# Patient Record
Sex: Male | Born: 1990 | Race: White | Hispanic: No | Marital: Single | Smoking: Never smoker
Health system: Southern US, Community
[De-identification: ages and names within clinical notes are randomized; demographics above are authoritative.]

## PROBLEM LIST (undated history)

## (undated) DIAGNOSIS — J45909 Unspecified asthma, uncomplicated: Secondary | ICD-10-CM

## (undated) DIAGNOSIS — D649 Anemia, unspecified: Secondary | ICD-10-CM

## (undated) HISTORY — DX: Anemia, unspecified: D64.9

## (undated) HISTORY — DX: Unspecified asthma, uncomplicated: J45.909

## (undated) HISTORY — PX: KNEE ARTHROPLASTY: SHX992

---

## 2018-04-14 ENCOUNTER — Other Ambulatory Visit: Payer: Self-pay | Admitting: Orthopedic Surgery

## 2018-04-14 DIAGNOSIS — M2212 Recurrent subluxation of patella, left knee: Secondary | ICD-10-CM

## 2018-04-22 ENCOUNTER — Ambulatory Visit
Admission: RE | Admit: 2018-04-22 | Discharge: 2018-04-22 | Disposition: A | Discharge status: Home / Self Care | Payer: BLUE CROSS/BLUE SHIELD | Source: Ambulatory Visit | Attending: Orthopedic Surgery | Admitting: Orthopedic Surgery

## 2018-04-22 DIAGNOSIS — M2212 Recurrent subluxation of patella, left knee: Secondary | ICD-10-CM

## 2018-07-18 ENCOUNTER — Encounter: Payer: Self-pay | Admitting: Nurse Practitioner

## 2018-08-09 ENCOUNTER — Encounter: Payer: Self-pay | Admitting: Nurse Practitioner

## 2018-08-09 ENCOUNTER — Encounter (INDEPENDENT_AMBULATORY_CARE_PROVIDER_SITE_OTHER): Payer: Self-pay

## 2018-08-09 ENCOUNTER — Ambulatory Visit (INDEPENDENT_AMBULATORY_CARE_PROVIDER_SITE_OTHER): Payer: BLUE CROSS/BLUE SHIELD | Admitting: Nurse Practitioner

## 2018-08-09 VITALS — BP 110/80 | HR 60 | Ht 74.0 in | Wt 182.8 lb

## 2018-08-09 DIAGNOSIS — R1310 Dysphagia, unspecified: Secondary | ICD-10-CM | POA: Diagnosis not present

## 2018-08-09 NOTE — Patient Instructions (Signed)
If you are age 25 or older, your body mass index should be between 23-30. Your Body mass index is 23.47 kg/m. If this is out of the aforementioned range listed, please consider follow up with your Primary Care Provider.  If you are age 25 or younger, your body mass index should be between 19-25. Your Body mass index is 23.47 kg/m. If this is out of the aformentioned range listed, please consider follow up with your Primary Care Provider.   You have been scheduled for an endoscopy. Please follow written instructions given to you at your visit today. If you use inhalers (even only as needed), please bring them with you on the day of your procedure. Your physician has requested that you go to www.startemmi.com and enter the access code given to you at your visit today. This web site gives a general overview about your procedure. However, you should still follow specific instructions given to you by our office regarding your preparation for the procedure.  Continue Prilosec.  Advised patient to eat small bites, chew well with liquids in between bites to avoid food impaction.  Thank you for choosing me and [facility].   Willette Cluster, NP

## 2018-08-09 NOTE — Progress Notes (Addendum)
    GI Provider:   New to practice             Chief Complaint: swallowing problems  Referring Provider:  Landry Mellow, MD   ASSESSMENT AND PLAN;   Healthy 27 yo male with five year history of progressive solid food dysphagia. Treated in July in [facility] for food impaction. Describes EGD wth removal of meat from esophagus.  -Patient needs EGD to rule out eosinophilic esophagitis.  Stricture seems unlikely given normal esophagram. The risks and benefits of EGD were discussed and the patient agrees to proceed.  -Advised patient to eat small bites, chew well with liquids in between bites to avoid food impaction. -continue PPI started at ED in July.    HPI:    27 year old male with hx of asthma but otherwise healthy presenting with ~ 5 year history of solid food dysphagia. Initially had problems with steak but over last several months he has problems with all meat. No problems swallowing anything else such as bread, potatoes or rice. On July 9th while in [facility] he went to ED with impaction. Underwent EGD with removal of food bolus. Stared on Prilosec in ED, taking it everyday. Doesn't know if better because avoiding meat unless in very small pieces. In high school he would get heartburn with tacos but otherwise no significant GERD sx. Per ED recommendations patient saw an ENT. Barium swallow ordered and normal.   Past Medical History:  Diagnosis Date  . Anemia   . Asthma      Past Surgical History:  Procedure Laterality Date  . KNEE ARTHROPLASTY     Family History  Problem Relation Age of Onset  . Diabetes Maternal Grandmother   . Heart disease Maternal Grandmother   . Diabetes Maternal Grandfather    Social History   Tobacco Use  . Smoking status: Never Smoker  . Smokeless tobacco: Never Used  Substance Use Topics  . Alcohol use: Not Currently  . Drug use: Never   Current Outpatient Medications  Medication Sig Dispense Refill  . ibuprofen (ADVIL,MOTRIN) 800 MG tablet Take  800 mg by mouth every 8 (eight) hours as needed.    Marland Kitchen omeprazole (PRILOSEC OTC) 20 MG tablet Take 20 mg by mouth daily.     No current facility-administered medications for this visit.    No Known Allergies   Review of Systems: All systems reviewed and negative except where noted in HPI.   Creatinine clearance cannot be calculated (No successful lab value found.)   Physical Exam:    Wt Readings from Last 3 Encounters:  08/09/18 182 lb 12.8 oz (82.9 kg)    BP 110/80   Pulse 60   Ht 6\' 2"  (1.88 m)   Wt 182 lb 12.8 oz (82.9 kg)   BMI 23.47 kg/m  Constitutional:  Pleasant male in no acute distress. Psychiatric: Normal mood and affect. Behavior is normal. EENT: Pupils normal.  Conjunctivae are normal. No scleral icterus. Neck supple.  Cardiovascular: Normal rate, regular rhythm. No edema Pulmonary/chest: Effort normal and breath sounds normal. No wheezing, rales or rhonchi. Abdominal: Soft, nondistended, nontender. Bowel sounds active throughout. There are no masses palpable. No hepatomegaly. Neurological: Alert and oriented to person place and time. Skin: Skin is warm and dry. No rashes noted.  Willette Cluster, NP  08/09/2018, 11:36 AM  Cc: Landry Mellow, MI [facility]

## 2018-08-11 ENCOUNTER — Encounter: Payer: Self-pay | Admitting: Nurse Practitioner

## 2018-08-11 ENCOUNTER — Encounter: Payer: Self-pay | Admitting: Gastroenterology

## 2018-08-11 NOTE — Progress Notes (Signed)
Agree with assessment and plan as outlined.  

## 2018-08-25 ENCOUNTER — Ambulatory Visit (AMBULATORY_SURGERY_CENTER): Payer: BLUE CROSS/BLUE SHIELD | Admitting: Gastroenterology

## 2018-08-25 ENCOUNTER — Encounter: Payer: Self-pay | Admitting: Gastroenterology

## 2018-08-25 VITALS — BP 120/88 | HR 73 | Temp 97.8°F | Resp 10 | Ht 74.0 in | Wt 182.0 lb

## 2018-08-25 DIAGNOSIS — K229 Disease of esophagus, unspecified: Secondary | ICD-10-CM | POA: Diagnosis not present

## 2018-08-25 DIAGNOSIS — R1310 Dysphagia, unspecified: Secondary | ICD-10-CM | POA: Diagnosis present

## 2018-08-25 DIAGNOSIS — K209 Esophagitis, unspecified: Secondary | ICD-10-CM

## 2018-08-25 MED ORDER — SODIUM CHLORIDE 0.9 % IV SOLN: 500.0000 mL | Freq: Once | INTRAVENOUS | Status: DC

## 2018-08-25 NOTE — Patient Instructions (Signed)
Handout given on GERD protocol. Biopsies taken to rule out Eosinophilic esophagitis. Eosinophilic esophagitis is benign and treatable.   YOU HAD AN ENDOSCOPIC PROCEDURE TODAY AT THE [facility]:   Refer to the procedure report that was given to you for any specific questions about what was found during the examination.  If the procedure report does not answer your questions, please call your gastroenterologist to clarify.  If you requested that your care partner not be given the details of your procedure findings, then the procedure report has been included in a sealed envelope for you to review at your convenience later.  YOU SHOULD EXPECT: Some feelings of bloating in the abdomen. Passage of more gas than usual.  Walking can help get rid of the air that was put into your GI tract during the procedure and reduce the bloating. If you had a lower endoscopy (such as a colonoscopy or flexible sigmoidoscopy) you may notice spotting of blood in your stool or on the toilet paper. If you underwent a bowel prep for your procedure, you may not have a normal bowel movement for a few days.  Please Note:  You might notice some irritation and congestion in your nose or some drainage.  This is from the oxygen used during your procedure.  There is no need for concern and it should clear up in a day or so.  SYMPTOMS TO REPORT IMMEDIATELY:    Following upper endoscopy (EGD)  Vomiting of blood or coffee ground material  New chest pain or pain under the shoulder blades  Painful or persistently difficult swallowing  New shortness of breath  Fever of 100F or higher  Black, tarry-looking stools  For urgent or emergent issues, a gastroenterologist can be reached at any hour by calling [phone].   DIET:  We do recommend a small meal at first, but then you may proceed to your regular diet.  Drink plenty of fluids but you should avoid alcoholic beverages for 24 hours.  ACTIVITY:  You should  plan to take it easy for the rest of today and you should NOT DRIVE or use heavy machinery until tomorrow (because of the sedation medicines used during the test).    FOLLOW UP: Our staff will call the number listed on your records the next business day following your procedure to check on you and address any questions or concerns that you may have regarding the information given to you following your procedure. If we do not reach you, we will leave a message.  However, if you are feeling well and you are not experiencing any problems, there is no need to return our call.  We will assume that you have returned to your regular daily activities without incident.  If any biopsies were taken you will be contacted by phone or by letter within the next 1-3 weeks.  Please call us at [phone] if you have not heard about the biopsies in 3 weeks.    SIGNATURES/CONFIDENTIALITY: You and/or your care partner have signed paperwork which will be entered into your electronic medical record.  These signatures attest to the fact that that the information above on your After Visit Summary has been reviewed and is understood.  Full responsibility of the confidentiality of this discharge information lies with you and/or your care-partner.

## 2018-08-25 NOTE — Progress Notes (Signed)
Called to room to assist during endoscopic procedure.  Patient ID and intended procedure confirmed with present staff. Received instructions for my participation in the procedure from the performing physician.  

## 2018-08-25 NOTE — Progress Notes (Signed)
Report given to PACU, vss 

## 2018-08-25 NOTE — Op Note (Signed)
[facility] Patient Name: Larry [name] Procedure Date: 08/25/2018 9:47 AM MRN: [id number] Endoscopist: Viviann SpareSteven P. Adela LankArmbruster , MD Age: [age] Referring MD:  Date of Birth: December 31, 1990 Gender: Male Account #: [id number] Procedure:                Upper GI endoscopy Indications:              Dysphagia Medicines:                Monitored Anesthesia Care Procedure:                Pre-Anesthesia Assessment:                           - Prior to the procedure, a History and Physical                            was performed, and patient medications and                            allergies were reviewed. The patient's tolerance of                            previous anesthesia was also reviewed. The risks                            and benefits of the procedure and the sedation                            options and risks were discussed with the patient.                            All questions were answered, and informed consent                            was obtained. Prior Anticoagulants: The patient has                            taken no previous anticoagulant or antiplatelet                            agents. ASA Grade Assessment: II - A patient with                            mild systemic disease. After reviewing the risks                            and benefits, the patient was deemed in                            satisfactory condition to undergo the procedure.                           After obtaining informed consent, the endoscope was  passed under direct vision. Throughout the                            procedure, the patient's blood pressure, pulse, and                            oxygen saturations were monitored continuously. The                            Endoscope was introduced through the mouth, and                            advanced to the second part of duodenum. The upper                            GI endoscopy was accomplished without  difficulty.                            The patient tolerated the procedure well. Scope In: Scope Out: Findings:                 Esophagogastric landmarks were identified: the                            Z-line was found at 39 cm, the gastroesophageal                            junction was found at 39 cm and the upper extent of                            the gastric folds was found at 40 cm from the                            incisors.                           A 1 cm hiatal hernia was present.                           Mild mucosal changes including ringed esophagus and                            longitudinal furrows were found in the entire                            esophagus. Biopsies were obtained from the proximal                            and distal esophagus with cold forceps for                            histology of suspected eosinophilic esophagitis.  The exam of the esophagus was otherwise normal. No                            focal stenosis / stricture appreciated.                           The entire examined stomach was normal.                           The duodenal bulb and second portion of the                            duodenum were normal. Complications:            No immediate complications. Estimated blood loss:                            Minimal. Estimated Blood Loss:     Estimated blood loss was minimal. Impression:               - Esophagogastric landmarks identified.                           - 1 cm hiatal hernia.                           - Mild esophageal mucosal changes suggestive of                            eosinophilic esophagitis. Biopsied. No stenosis                            appreciated.                           - Normal stomach.                           - Normal duodenal bulb and second portion of the                            duodenum. Recommendation:           - Patient has a contact number available for                             emergencies. The signs and symptoms of potential                            delayed complications were discussed with the                            patient. Return to normal activities tomorrow.                            Written discharge instructions were provided to the  patient.                           - Resume previous diet.                           - Continue present medications.                           - Await pathology results. Will plan on starting                            oral flovent and referral for food allergy testing                            if biopsies positive for eosinophilic esophagitis Viviann Spare P. Armbruster, MD 08/25/2018 10:05:25 AM This report has been signed electronically.

## 2018-08-25 NOTE — Progress Notes (Signed)
Pt's states no medical or surgical changes since previsit or office visit. 

## 2018-08-28 ENCOUNTER — Telehealth: Payer: Self-pay | Admitting: *Deleted

## 2018-08-28 NOTE — Telephone Encounter (Signed)
  Follow up Call-  Call back number 08/25/2018  Post procedure Call Back phone  # [phone]  Permission to leave phone message Yes     Patient questions:  Do you have a fever, pain , or abdominal swelling? No. Pain Score  0 *  Have you tolerated food without any problems? Yes.    Have you been able to return to your normal activities? Yes.    Do you have any questions about your discharge instructions: Diet   No. Medications  No. Follow up visit  No.  Do you have questions or concerns about your Care? No.  Actions: * If pain score is 4 or above: No action needed, pain <4.

## 2018-08-30 ENCOUNTER — Other Ambulatory Visit: Payer: Self-pay

## 2018-08-30 DIAGNOSIS — K200 Eosinophilic esophagitis: Secondary | ICD-10-CM

## 2018-08-30 DIAGNOSIS — R1310 Dysphagia, unspecified: Secondary | ICD-10-CM

## 2018-08-30 MED ORDER — FLUTICASONE PROPIONATE HFA 220 MCG/ACT IN AERO: INHALATION_SPRAY | RESPIRATORY_TRACT | 1 refills | Status: AC

## 2018-10-02 ENCOUNTER — Ambulatory Visit (INDEPENDENT_AMBULATORY_CARE_PROVIDER_SITE_OTHER): Payer: BLUE CROSS/BLUE SHIELD | Admitting: Allergy

## 2018-10-02 ENCOUNTER — Encounter: Payer: Self-pay | Admitting: Allergy

## 2018-10-02 VITALS — BP 110/72 | HR 64 | Temp 97.7°F | Resp 16 | Ht 73.8 in | Wt 187.0 lb

## 2018-10-02 DIAGNOSIS — J3089 Other allergic rhinitis: Secondary | ICD-10-CM

## 2018-10-02 DIAGNOSIS — K200 Eosinophilic esophagitis: Secondary | ICD-10-CM | POA: Diagnosis not present

## 2018-10-02 DIAGNOSIS — T781XXD Other adverse food reactions, not elsewhere classified, subsequent encounter: Secondary | ICD-10-CM

## 2018-10-02 DIAGNOSIS — J45998 Other asthma: Secondary | ICD-10-CM | POA: Diagnosis not present

## 2018-10-02 DIAGNOSIS — T7819XD Other adverse food reactions, not elsewhere classified, subsequent encounter: Secondary | ICD-10-CM

## 2018-10-02 MED ORDER — ALBUTEROL SULFATE HFA 108 (90 BASE) MCG/ACT IN AERS: 2.0000 | INHALATION_SPRAY | RESPIRATORY_TRACT | 1 refills | Status: AC | PRN

## 2018-10-02 NOTE — Assessment & Plan Note (Addendum)
One episode of food impaction requiring EGD in the ER. History of difficulty swallowing certain foods for few years occurring once a month. EGD on 08/25/2018 showed eos up to 19/hpf which was done on PPI. Currently on omeprazole 20mg  daily and Flovent 220 2 puffs twice a day.  Today's skin testing showed: borderline positive to peanuts, tree nuts, soy, peas, navy bean, corn, watermelon and cantaloupe.   Discussed with patient that the above foods may or may not be contributing to his symptoms as eosinophilic esophagitis is usually a T cell mediated disease and skin prick test has a low sensitivity and specificity for this.  Start to avoid above items and monitor symptoms.  Continue omeprazole 20mg  daily.  Continue Flovent 220 2 puffs twice a day swallowed. NOTHING to eat or drink for 30 minutes afterwards.  Continue to follow up with GI.

## 2018-10-02 NOTE — Assessment & Plan Note (Signed)
Rhinoconjunctivitis symptoms mainly during the spring for the past few years.  Using Claritin with good benefit.  Consider testing in future.  May use over the counter antihistamines such as Zyrtec (cetirizine), Claritin (loratadine), Allegra (fexofenadine), or Xyzal (levocetirizine) daily as needed.

## 2018-10-02 NOTE — Progress Notes (Signed)
New Patient Note  RE: Larry [name] MRN: [id number] DOB: 06-Sep-1991 Date of Office Visit: 10/02/2018  Referring provider: Benancio Deeds, MD Primary care provider: Patient, No Pcp [name]  Chief Complaint: New Patient (Initial Visit) and Dysphagia (certain foods would like to be tested for poss food allergy)  History of Present Illness: I had the pleasure of seeing Larry [name] for initial evaluation at the [facility] on 10/02/2018. He is a 27 y.o. [name], who is referred here by GI Dr. Fritzi [name] for the evaluation of eosinophilic esophagitis.   For the past few years patient noticed difficulty swallowing [name] certain foods. In July 2019 he had a piece of chicken stuck in his esophagus and went to the ER after 12-13 hours. They did remove the food bolus in the ER. This was the first time he had to get an EGD. This would usually occur every couple of months initially but more recently it was happening on a monthly basis.  Triggers include steak and chicken.   Patient was started on Prilosec 20mg  daily after the ER visit and had no issues but patient has bee more careful about eating meats. He was also started on Flovent 220 2 puffs twice a day after the EGD and tolerating it [name] no issues. No issues [name] swallowing foods since then.  Past work up includes: none Dietary History: patient has been eating other foods including limited milk, eggs, peanut, treenuts, sesame, soy, wheat, meats, fruits and vegetables. Does not like seafood, shellfish.   08/25/2018 EGD: - Esophagogastric landmarks identified. - 1 cm hiatal hernia. - Mild esophageal mucosal changes suggestive of eosinophilic esophagitis. Biopsied. No stenosis appreciated. - Normal stomach. - Normal duodenal bulb and second portion of the duodenum. Pathology report:  - INFLAMED SQUAMOUS MUCOSA [name] FOCAL INCREASED INTRAEPITHELIAL EOSINOPHILS (UP TO 19/HIGH POWER FIELD). - THERE IS NO EVIDENCE OF  DYSPLASIA OR MALIGNANCY.  Assessment and Plan: Larry [name] is a 27 y.o. [name] [name]: Eosinophilic esophagitis One episode of food impaction requiring EGD in the ER. History of difficulty swallowing certain foods for few years occurring once a month. EGD on 08/25/2018 showed eos up to 19/hpf which was done on PPI. Currently on omeprazole 20mg  daily and Flovent 220 2 puffs twice a day.  Today's skin testing showed: borderline positive to peanuts, tree nuts, soy, peas, navy bean, corn, watermelon and cantaloupe.   Discussed [name] patient that the above foods may or may not be contributing to his symptoms as eosinophilic esophagitis is usually a T cell mediated disease and skin prick test has a low sensitivity and specificity for this.  Start to avoid above items and monitor symptoms.  Continue omeprazole 20mg  daily.  Continue Flovent 220 2 puffs twice a day swallowed. NOTHING to eat or drink for 30 minutes afterwards.  Continue to follow up [name] GI.  Other asthma Diagnosed [name] asthma at age 54. Triggers now include exercising in the cold weather.   Today's spirometry was normal.  Monitor symptoms.   May use albuterol rescue inhaler 2 puffs or nebulizer every 4 to 6 hours as needed for shortness of breath, chest tightness, coughing, and wheezing. May use albuterol rescue inhaler 2 puffs 5 to 15 minutes prior to strenuous physical activities. Reviewed proper technique.  Other allergic rhinitis Rhinoconjunctivitis symptoms mainly during the spring for the past few years.  Using Claritin [name] good benefit.  Consider testing in future.  May use over the counter antihistamines such as Zyrtec (cetirizine), Claritin (  loratadine), Allegra (fexofenadine), or Xyzal (levocetirizine) daily as needed.  Return in about 3 months (around 01/02/2019).  Meds ordered this encounter  Medications  . albuterol (PROAIR HFA) 108 (90 Base) MCG/ACT inhaler    Sig: Inhale 2 puffs into the lungs every 4 (four) hours  as needed for wheezing or shortness of breath.    Dispense:  1 Inhaler    Refill:  1   Other allergy screening: Asthma: yes as a child and no issues for over 10 years.  He reports symptoms of shortness of breath, coughing, wheezing in elementary school. Current medications include none.  He reports not using aerochamber [name] asthma inhalers. He tried the following inhalers: albuterol and Flovent. Main asthma triggers are cold weather changes, exercise. In the last month, frequency of asthma symptoms: 0x/week. Frequency of nocturnal symptoms: 0x/month. Frequency of SABA use: 0x/week. Interference [name] physical activity: no. Sleep is undisturbed. In the last 12 months, emergency room visits/urgent care visits/doctor office visits or hospitalizations due to asthma: 0. In the last 12 months, oral steroids courses: 0 Lifetime history of hospitalization for asthma: no. Prior intubations: no. Asthma was diagnosed at age 77. History of pneumonia: no. He was not evaluated by allergist/pulmonologist in the past. Smoking exposure: no. Up to date [name] flu vaccine: not yet.  Rhino conjunctivitis: yes  He reports symptoms of rhinitis, itchy eyes. Symptoms have been going on for many years. The symptoms are present during the spring. Other triggers include exposure to pet dander. He has used Claritin [name] fair improvement in symptoms. Previous work up includes: no. Food allergy: no Medication allergy: no Hymenoptera allergy: no Urticaria: no Eczema:no History of recurrent infections suggestive of immunodeficency: no  Diagnostics: Spirometry:  Tracings reviewed. His effort: Good reproducible efforts. FVC: 5.68 L FEV1: 4.73 L, 93 % predicted FEV1/FVC ratio: 84 % Interpretation: Spirometry consistent [name] normal pattern.  Please see scanned spirometry results for details.  Skin Testing: Food allergy panel. Positive test to: borderline positive to peanuts, tree nuts, soy, peas, navy bean, corn, watermelon and  cantaloupe. Results discussed [name] patient/family. Food Adult Perc - 10/02/18 0900    Time Antigen Placed  4098    Allergen Manufacturer  Larry [name]    Location  Back    Number of allergen test  74     Control-buffer 50% Glycerol  Negative    Control-Histamine 1 mg/ml  2+    1. Peanut  2+   2x2   2. Soybean  2+   2x2   3. Wheat  Negative    4. Sesame  Negative    5. Milk, cow  Negative    6. Egg White, Chicken  Negative    7. Casein  Negative    8. Shellfish Mix  Negative    9. Fish Mix  Negative    10. Cashew  Negative    11. Pecan Food  Negative    12. Walnut Food  2+   2x2   13. Almond  2+   2x2   14. Hazelnut  Negative    15. [place] nut  2+   2x2   16. Coconut  Negative    17. Pistachio  Negative    18. Catfish  Negative    19. Bass  Negative    20. Trout  Negative    21. Tuna  Negative    22. Salmon  Negative    23. Flounder  Negative    24. Codfish  Negative    25.  Shrimp  Negative    26. Crab  Negative    27. Lobster  Negative    28. Oyster  Negative    29. Scallops  Negative    30. Barley  Negative    31. Oat   Negative    32. Rye   Negative    33. Hops  Negative    34. Rice  Negative    35. Cottonseed  Negative    36. Saccharomyces Cerevisiae   Negative    37. Pork  Negative    38. [place] Meat  Negative    39. Chicken Meat  Negative    40. Beef  Negative    41. Lamb  Negative    42. Tomato  Negative    43. White Potato  Negative    44. Sweet Potato  Negative    45. Pea, Green/English  3+   3x3   46. Navy Bean  3+   5x4   47. Mushrooms  Negative    48. Avocado  Negative    49. Onion  Negative    50. Cabbage  Negative    51. Carrots  Negative    52. Celery  Negative    53. Corn  3+   3x3   54. Cucumber  Negative    55. Grape (White seedless)  Negative    56. Orange   Negative    57. Banana  Negative    58. Apple  Negative    59. Peach  Negative    60. Strawberry  Negative    61. Cantaloupe  2+   2x2   62. Watermelon  2+   2x2   63.  Pineapple  Negative    64. Chocolate/Cacao bean  Negative    65. Karaya Gum  Negative    66. Acacia (Arabic Gum)  Negative    67. Cinnamon  Negative    68. Nutmeg  Negative    69. Ginger  Negative    70. Garlic  Negative    71. Pepper, black  Negative    72. Mustard  Negative       Past Medical History: Patient Active Problem List   Diagnosis Date Noted  . Eosinophilic esophagitis 10/02/2018  . Other asthma 10/02/2018  . Other allergic rhinitis 10/02/2018   Past Medical History:  Diagnosis Date  . Anemia   . Asthma    as a child   Past Surgical History: Past Surgical History:  Procedure Laterality Date  . KNEE ARTHROPLASTY     Medication List:  Current Outpatient Medications  Medication Sig Dispense Refill  . acetaminophen (TYLENOL) 500 MG tablet Take 500 mg by mouth every 6 (six) hours as needed.    . fluticasone (FLOVENT HFA) 220 MCG/ACT inhaler 2 puffs into your mouth twice daily, Do not inhale but swallow. Do not to eat or drink anything 30 minutes after ingesting, use for 6 wks 1 Inhaler 1  . loratadine (CLARITIN) 10 MG tablet Take 10 mg by mouth daily.    Marland Kitchen omeprazole (PRILOSEC OTC) 20 MG tablet Take 20 mg by mouth daily.    Marland Kitchen albuterol (PROAIR HFA) 108 (90 Base) MCG/ACT inhaler Inhale 2 puffs into the lungs every 4 (four) hours as needed for wheezing or shortness of breath. 1 Inhaler 1   No current facility-administered medications for this visit.    Allergies: No Known Allergies Social History: Social History   Socioeconomic History  . Marital status: Single    Spouse  name: Not on file  . Number of children: Not on file  . Years of education: Not on file  . Highest education level: Not on file  Occupational History  . Not on file  Social Needs  . Financial resource strain: Not on file  . Food insecurity:    Worry: Not on file    Inability: Not on file  . Transportation needs:    Medical: Not on file    Non-medical: Not on file  Tobacco Use  .  Smoking status: Never Smoker  . Smokeless tobacco: Never Used  Substance and Sexual Activity  . Alcohol use: Yes    Comment: 2  . Drug use: Never  . Sexual activity: Not on file  Lifestyle  . Physical activity:    Days [name] week: Not on file    Minutes [name] session: Not on file  . Stress: Not on file  Relationships  . Social connections:    Talks on phone: Not on file    Gets together: Not on file    Attends religious service: Not on file    Active member of club or organization: Not on file    Attends meetings of clubs or organizations: Not on file    Relationship status: Not on file  Other Topics Concern  . Not on file  Social History Narrative  . Not on file   Lives in a apartment. Smoking: Denies Occupation: Counselling psychologist at [employer].  Environmental History: Water Damage/mildew in the house: no Carpet in the family room: no Carpet in the bedroom: no Heating: electric Cooling: central Pet: no  Family History: Family History  Problem Relation Age of Onset  . Diabetes Maternal Grandmother   . Heart disease Maternal Grandmother   . Diabetes Maternal Grandfather   . Asthma Brother   . Esophageal cancer Neg Hx   . Stomach cancer Neg Hx   . Eczema Neg Hx    Review of Systems  Constitutional: Negative for appetite change, chills, fever and unexpected weight change.  HENT: Negative for congestion and rhinorrhea.   Eyes: Negative for itching.  Respiratory: Negative for cough, chest tightness, shortness of breath and wheezing.   Cardiovascular: Negative for chest pain.  Gastrointestinal: Negative for abdominal pain.  Genitourinary: Negative for difficulty urinating.  Skin: Negative for rash.  Allergic/Immunologic: Positive for environmental allergies. Negative for food allergies.  Neurological: Negative for headaches.   Objective: BP 110/72 (BP Location: Left Arm, Patient Position: Sitting, Cuff Size: Normal)   Pulse 64   Temp 97.7 F (36.5 C) (Oral)   Resp 16    Ht 6' 1.8" (1.875 m)   Wt 187 lb (84.8 kg)   BMI 24.14 kg/m  Body mass index is 24.14 kg/m. Physical Exam  Constitutional: He is oriented to person, place, and time. He appears well-developed and well-nourished.  HENT:  Head: Normocephalic and atraumatic.  Right Ear: External ear normal.  Left Ear: External ear normal.  Nose: Nose normal.  Mouth/Throat: Oropharynx is clear and moist.  Eyes: Conjunctivae and EOM are normal.  Neck: Neck supple.  Cardiovascular: Normal rate, regular rhythm and normal heart sounds. Exam reveals no gallop and no friction rub.  No murmur heard. Pulmonary/Chest: Effort normal and breath sounds normal. He has no wheezes. He has no rales.  Abdominal: Soft. Bowel sounds are normal. There is no tenderness.  Lymphadenopathy:    He has no cervical adenopathy.  Neurological: He is alert and oriented to person, place, and time.  Skin: Skin is warm. No rash noted.  Psychiatric: He has a normal mood and affect. His behavior is normal.  Nursing note and vitals reviewed.  The plan was reviewed [name] the patient/family, and all questions/concerned were addressed.  It was my pleasure to see Ferdinando today and participate in his care. Please feel free to contact me [name] any questions or concerns.  Sincerely,  Wyline Mood, DO Allergy & Immunology  [facility] office: [phone] [facility] office:[phone]

## 2018-10-02 NOTE — Assessment & Plan Note (Addendum)
Diagnosed with asthma at age 27. Triggers now include exercising in the cold weather.   Today's spirometry was normal.  Monitor symptoms.   May use albuterol rescue inhaler 2 puffs or nebulizer every 4 to 6 hours as needed for shortness of breath, chest tightness, coughing, and wheezing. May use albuterol rescue inhaler 2 puffs 5 to 15 minutes prior to strenuous physical activities. Reviewed proper technique.

## 2018-10-02 NOTE — Patient Instructions (Addendum)
Eosinophilic esophagitis One episode of food impaction requiring EGD in the ER. History of difficulty swallowing certain foods for few years occurring once a month. EGD on 08/25/2018 showed eos up to 19/hpf which was done on PPI. Currently on omeprazole 20mg  daily and Flovent 220 2 puffs twice a day.  Today's skin testing showed: borderline positive to peanuts, tree nuts, soy, peas, navy bean, corn, watermelon and cantaloupe.   Discussed with patient that the above foods may or may not be contributing to his symptoms as eosinophilic esophagitis is usually a T cell mediated disease and skin prick test has a low sensitivity and specificity for this.  Start to avoid above items and monitor symptoms.  Continue omeprazole 20mg  daily.  Continue Flovent 220 2 puffs twice a day swallowed. NOTHING to eat or drink for 30 minutes afterwards.  Continue to follow up with GI.  Other asthma Diagnosed with asthma at age 35. Triggers now include exercising in the cold weather.   Today's spirometry was normal.  Monitor symptoms.   May use albuterol rescue inhaler 2 puffs or nebulizer every 4 to 6 hours as needed for shortness of breath, chest tightness, coughing, and wheezing. May use albuterol rescue inhaler 2 puffs 5 to 15 minutes prior to strenuous physical activities. Reviewed proper technique.  Other allergic rhinitis Rhinoconjunctivitis symptoms mainly during the spring for the past few years.  Using Claritin with good benefit.  Consider testing in future.  May use over the counter antihistamines such as Zyrtec (cetirizine), Claritin (loratadine), Allegra (fexofenadine), or Xyzal (levocetirizine) daily as needed.  Return in about 3 months (around 01/02/2019).

## 2019-01-03 ENCOUNTER — Ambulatory Visit: Payer: BLUE CROSS/BLUE SHIELD | Admitting: Allergy

## 2019-01-04 ENCOUNTER — Encounter: Payer: Self-pay | Admitting: Allergy

## 2019-01-04 ENCOUNTER — Ambulatory Visit (INDEPENDENT_AMBULATORY_CARE_PROVIDER_SITE_OTHER): Payer: BLUE CROSS/BLUE SHIELD | Admitting: Allergy

## 2019-01-04 VITALS — BP 100/58 | HR 72 | Resp 16 | Ht 75.0 in | Wt 184.2 lb

## 2019-01-04 DIAGNOSIS — K200 Eosinophilic esophagitis: Secondary | ICD-10-CM | POA: Diagnosis not present

## 2019-01-04 DIAGNOSIS — T781XXD Other adverse food reactions, not elsewhere classified, subsequent encounter: Secondary | ICD-10-CM | POA: Diagnosis not present

## 2019-01-04 DIAGNOSIS — J3089 Other allergic rhinitis: Secondary | ICD-10-CM | POA: Diagnosis not present

## 2019-01-04 DIAGNOSIS — J45998 Other asthma: Secondary | ICD-10-CM

## 2019-01-04 NOTE — Patient Instructions (Addendum)
Eosinophilic esophagitis  Continue to avoid peanuts, tree nuts, soy, peas, navy bean, corn, watermelon and cantaloupe.   Monitor symptoms and if worsening or more frequent let us know.   Restart omeprazole 20mg  daily.  Call GI regarding follow up and repeat EGD.  May try soy lecithin containing foods.  Other asthma  Monitor symptoms.   May use albuterol rescue inhaler 2 puffs or nebulizer every 4 to 6 hours as needed for shortness of breath, chest tightness, coughing, and wheezing. May use albuterol rescue inhaler 2 puffs 5 to 15 minutes prior to strenuous physical activities. Reviewed proper technique.  Other allergic rhinitis  May use over the counter antihistamines such as Zyrtec (cetirizine), Claritin (loratadine), Allegra (fexofenadine), or Xyzal (levocetirizine) daily as needed.  Follow up in 1 year.

## 2019-01-04 NOTE — Progress Notes (Signed)
Follow Up Note  RE: Larry [name] MRN: [id number] DOB: [birth year] Date of Office Visit: 01/04/2019  Referring provider: Benancio Deeds, MD Primary care provider: Patient, No Pcp Per  Chief Complaint: Dysphagia  History of Present Illness: I had the pleasure of seeing Larry [name] for a follow up visit at the [facility] on 01/04/2019. He is a 28 y.o. male, who is being followed for eoe, asthma, allergic rhinitis. Today he is here for regular follow up visit. His previous allergy office visit was on 10/02/2018 with Dr. Selena Batten.   Eosinophilic esophagitis Currently trying to avoid peanuts, tree nuts, soy, peas, corn, beans and melons and noticed improvement. No issues with swallowing like before. Around Thanksgiving time patient had a muffin which contained soy which gave him abdominal pains. He also had a chicken pot pie with corn which caused some throat issues. He also had a few other episodes which did not contain the above foods though.   Stopped Flovent and omeprazole around November did not notice any worsening symptoms.  Patient did not follow up with GI since the last visit and no scope since the last visit.   Other asthma Well-controlled. No issues.   Other allergic rhinitis Stable. No issues.   Assessment and Plan: Larry [name] is a 28 y.o. male with: Eosinophilic esophagitis Past history - One episode of food impaction requiring EGD in the ER. History of difficulty swallowing certain foods for few years occurring once a month. EGD on 08/25/2018 showed eos up to 19/hpf which was done on PPI. 2019 skin testing showed: borderline positive to peanuts, tree nuts, soy, peas, navy bean, corn, watermelon and cantaloupe.  Interim history - stopped omeprazole and Flovent in November. No worsening symptoms. Trying to avoid foods. Noticed a few episodes of symptoms without ingestion of above mentioned foods.  Discussed with patient that the above foods may or  may not be contributing to his symptoms as eosinophilic esophagitis is usually a T cell mediated disease and skin prick test has a low sensitivity and specificity for this.  Continue to avoid peanuts, tree nuts, soy, peas, navy bean, corn, watermelon and cantaloupe.   Monitor symptoms and if worsening or more frequent let us know.   Restart omeprazole 20mg  daily.  Call GI regarding follow up and repeat EGD.  May try soy lecithin containing foods.  Other asthma Past history - Diagnosed with asthma at age 54. Triggers now include exercising in the cold weather.  Interim history - Well controlled.   Today's spirometry was normal.  Monitor symptoms.   May use albuterol rescue inhaler 2 puffs or nebulizer every 4 to 6 hours as needed for shortness of breath, chest tightness, coughing, and wheezing. May use albuterol rescue inhaler 2 puffs 5 to 15 minutes prior to strenuous physical activities.  Other allergic rhinitis Past history - Rhinoconjunctivitis symptoms mainly during the spring for the past few years.  Using Claritin with good benefit. Interim history - stable.   Consider testing in future.  May use over the counter antihistamines such as Zyrtec (cetirizine), Claritin (loratadine), Allegra (fexofenadine), or Xyzal (levocetirizine) daily as needed.  Return in about 1 year (around 01/05/2020).  Diagnostics: Spirometry:  Tracings reviewed. His effort: Good reproducible efforts. FVC: 5.88L FEV1: 4.67L, 89% predicted FEV1/FVC ratio: 79% Interpretation: Spirometry consistent with normal pattern.  Please see scanned spirometry results for details.  Medication List:  Current Outpatient Medications  Medication Sig Dispense Refill  . acetaminophen (TYLENOL) 500 MG tablet  Take 500 mg by mouth every 6 (six) hours as needed.    Marland Kitchen. albuterol (PROAIR HFA) 108 (90 Base) MCG/ACT inhaler Inhale 2 puffs into the lungs every 4 (four) hours as needed for wheezing or shortness of breath. 1  Inhaler 1  . fluticasone (FLOVENT HFA) 220 MCG/ACT inhaler 2 puffs into your mouth twice daily, Do not inhale but swallow. Do not to eat or drink anything 30 minutes after ingesting, use for 6 wks 1 Inhaler 1  . loratadine (CLARITIN) 10 MG tablet Take 10 mg by mouth daily.    Marland Kitchen. omeprazole (PRILOSEC OTC) 20 MG tablet Take 20 mg by mouth daily.     No current facility-administered medications for this visit.    Allergies: No Known Allergies I reviewed his past medical history, social history, family history, and environmental history and no significant changes have been reported from previous visit on 10/02/2018.  Review of Systems  Constitutional: Negative for appetite change, chills, fever and unexpected weight change.  HENT: Negative for congestion and rhinorrhea.   Eyes: Negative for itching.  Respiratory: Negative for cough, chest tightness, shortness of breath and wheezing.   Cardiovascular: Negative for chest pain.  Gastrointestinal: Negative for abdominal pain.  Genitourinary: Negative for difficulty urinating.  Skin: Negative for rash.  Allergic/Immunologic: Positive for environmental allergies and food allergies.  Neurological: Negative for headaches.   Objective: BP (!) 100/58   Pulse 72   Resp 16   Ht 6\' 3"  (1.905 m)   Wt 184 lb 3.2 oz (83.6 kg)   SpO2 97%   BMI 23.02 kg/m  Body mass index is 23.02 kg/m. Physical Exam  Constitutional: He is oriented to person, place, and time. He appears well-developed and well-nourished.  HENT:  Head: Normocephalic and atraumatic.  Right Ear: External ear normal.  Left Ear: External ear normal.  Nose: Nose normal.  Mouth/Throat: Oropharynx is clear and moist.  Eyes: Conjunctivae and EOM are normal.  Neck: Neck supple.  Cardiovascular: Normal rate, regular rhythm and normal heart sounds. Exam reveals no gallop and no friction rub.  No murmur heard. Pulmonary/Chest: Effort normal and breath sounds normal. He has no wheezes. He  has no rales.  Abdominal: Soft.  Neurological: He is alert and oriented to person, place, and time.  Skin: Skin is warm. No rash noted.  Psychiatric: He has a normal mood and affect. His behavior is normal.  Nursing note and vitals reviewed.  Previous notes and tests were reviewed. The plan was reviewed with the patient/family, and all questions/concerned were addressed.  It was my pleasure to see Larry [name] today and participate in his care. Please feel free to contact me with any questions or concerns.  Sincerely,  Wyline MoodYoon , DO Allergy & Immunology  [facility] [place] office: [phone] [facility] office: [phone]

## 2019-01-04 NOTE — Assessment & Plan Note (Signed)
Past history - One episode of food impaction requiring EGD in the ER. History of difficulty swallowing certain foods for few years occurring once a month. EGD on 08/25/2018 showed eos up to 19/hpf which was done on PPI. 2019 skin testing showed: borderline positive to peanuts, tree nuts, soy, peas, navy bean, corn, watermelon and cantaloupe.  Interim history - stopped omeprazole and Flovent in November. No worsening symptoms. Trying to avoid foods. Noticed a few episodes of symptoms without ingestion of above mentioned foods.  Discussed with patient that the above foods may or may not be contributing to his symptoms as eosinophilic esophagitis is usually a T cell mediated disease and skin prick test has a low sensitivity and specificity for this.  Continue to avoid peanuts, tree nuts, soy, peas, navy bean, corn, watermelon and cantaloupe.   Monitor symptoms and if worsening or more frequent let us know.   Restart omeprazole 20mg  daily.  Call GI regarding follow up and repeat EGD.  May try soy lecithin containing foods.

## 2019-01-04 NOTE — Assessment & Plan Note (Signed)
Past history - Diagnosed with asthma at age 89. Triggers now include exercising in the cold weather.  Interim history - Well controlled.   Today's spirometry was normal.  Monitor symptoms.   May use albuterol rescue inhaler 2 puffs or nebulizer every 4 to 6 hours as needed for shortness of breath, chest tightness, coughing, and wheezing. May use albuterol rescue inhaler 2 puffs 5 to 15 minutes prior to strenuous physical activities.

## 2019-01-04 NOTE — Assessment & Plan Note (Signed)
Past history - Rhinoconjunctivitis symptoms mainly during the spring for the past few years.  Using Claritin with good benefit. Interim history - stable.   Consider testing in future.  May use over the counter antihistamines such as Zyrtec (cetirizine), Claritin (loratadine), Allegra (fexofenadine), or Xyzal (levocetirizine) daily as needed.

## 2019-01-09 ENCOUNTER — Encounter: Payer: Self-pay | Admitting: Gastroenterology

## 2019-06-04 ENCOUNTER — Encounter (INDEPENDENT_AMBULATORY_CARE_PROVIDER_SITE_OTHER): Payer: Self-pay

## 2019-06-04 ENCOUNTER — Encounter: Payer: Self-pay | Admitting: Physician Assistant

## 2019-06-04 ENCOUNTER — Telehealth: Payer: BLUE CROSS/BLUE SHIELD | Admitting: Physician Assistant

## 2019-06-04 DIAGNOSIS — Z20822 Contact with and (suspected) exposure to covid-19: Secondary | ICD-10-CM

## 2019-06-04 DIAGNOSIS — R059 Cough, unspecified: Secondary | ICD-10-CM

## 2019-06-04 DIAGNOSIS — R05 Cough: Secondary | ICD-10-CM

## 2019-06-04 MED ORDER — BENZONATATE 100 MG PO CAPS: 100.0000 mg | ORAL_CAPSULE | Freq: Three times a day (TID) | ORAL | 0 refills | Status: AC | PRN

## 2019-06-04 NOTE — Progress Notes (Signed)
E-Visit for Corona Virus Screening   Your current symptoms could be consistent with the coronavirus.  Call your health care provider or local health department to request and arrange formal testing. Many health care providers can now test patients at their office but not all are.  Please quarantine yourself while awaiting your test results.  [facility] [phone], [facility] [phone], [facility] [phone] or visit [email or web address]  and You have been enrolled in MyChart Home Monitoring for COVID-19.  Daily you will receive a questionnaire within the MyChart website. Our COVID-19 response team will be monitoring your responses daily.    COVID-19 is a respiratory illness with symptoms that are similar to the flu. Symptoms are typically mild to moderate, but there have been cases of severe illness and death due to the virus. The following symptoms may appear 2-14 days after exposure: . Fever . Cough . Shortness of breath or difficulty breathing . Chills . Repeated shaking with chills . Muscle pain . Headache . Sore throat . New loss of taste or smell . Fatigue . Congestion or runny nose . Nausea or vomiting . Diarrhea  It is vitally important that if you feel that you have an infection such as this virus or any other virus that you stay home and away from places where you may spread it to others.  You should self-quarantine for 14 days if you have symptoms that could potentially be coronavirus or have been in close contact a with a person diagnosed with COVID-19 within the last 2 weeks. You should avoid contact with people age 65 and older.   You should wear a mask or cloth face covering over your nose and mouth if you must be around other people or animals, including pets (even at home). Try to stay at least 6 feet away from other people. This will protect the  people around you.  You can use medication such as A prescription cough medication called Tessalon Perles 100 mg. You may take 1-2 capsules every 8 hours as needed for cough  You may also take acetaminophen (Tylenol) as needed for fever.  I have also provided a work note.    Reduce your risk of any infection by using the same precautions used for avoiding the common cold or flu:  . Wash your hands often with soap and warm water for at least 20 seconds.  If soap and water are not readily available, use an alcohol-based hand sanitizer with at least 60% alcohol.  . If coughing or sneezing, cover your mouth and nose by coughing or sneezing into the elbow areas of your shirt or coat, into a tissue or into your sleeve (not your hands). . Avoid shaking hands with others and consider head nods or verbal greetings only. . Avoid touching your eyes, nose, or mouth with unwashed hands.  . Avoid close contact with people who are sick. . Avoid places or events with large numbers of people in one location, like concerts or sporting events. . Carefully consider travel plans you have or are making. . If you are planning any travel outside or inside the [place], visit the CDC's Travelers' Health webpage for the latest health notices. . If you have some symptoms but not all symptoms, continue to monitor at home and seek medical attention if your symptoms worsen. . If you are having a medical emergency, call 911.  HOME CARE . Only take medications as instructed by your medical team. .   Drink plenty of fluids and get plenty of rest. . A steam or ultrasonic humidifier can help if you have congestion.   GET HELP RIGHT AWAY IF YOU HAVE EMERGENCY WARNING SIGNS** FOR COVID-19. If you or someone is showing any of these signs seek emergency medical care immediately. Call 911 or proceed to your closest emergency facility if: . You develop worsening high fever. . Trouble breathing . Bluish lips or face . Persistent pain  or pressure in the chest . New confusion . Inability to wake or stay awake . You cough up blood. . Your symptoms become more severe  **This list is not all possible symptoms. Contact your medical provider for any symptoms that are sever or concerning to you.   MAKE SURE YOU   Understand these instructions.  Will watch your condition.  Will get help right away if you are not doing well or get worse.  Your e-visit answers were reviewed by a board certified advanced clinical practitioner to complete your personal care plan.  Depending on the condition, your plan could have included both over the counter or prescription medications.  If there is a problem please reply once you have received a response from your provider.  Your safety is important to us.  If you have drug allergies check your prescription carefully.    You can use MyChart to ask questions about today's visit, request a non-urgent call back, or ask for a work or school excuse for 24 hours related to this e-Visit. If it has been greater than 24 hours you will need to follow up with your provider, or enter a new e-Visit to address those concerns. You will get an e-mail in the next two days asking about your experience.  I hope that your e-visit has been valuable and will speed your recovery. Thank you for using e-visits.   I spent 5-10 minutes on review and completion of this note- Harold Mattes PAC  

## 2019-06-08 ENCOUNTER — Encounter (INDEPENDENT_AMBULATORY_CARE_PROVIDER_SITE_OTHER): Payer: Self-pay

## 2019-11-27 IMAGING — MR MR KNEE*L* W/O CM
4 of 6 series · 19 of 40 positions shown · non-contrast
Comparison: None.

CLINICAL DATA: Left knee pain after patellar dislocation 2 weeks
ago while playing basketball.

EXAM:
MRI OF THE LEFT KNEE WITHOUT CONTRAST
TECHNIQUE: Multiplanar, multisequence MR imaging of the knee was performed. No
intravenous contrast was administered.

[Series 3: PD fat-sat · axial · 3.5mm · 0.31mm/px · z∈[-28,+73]mm · 8 of 25 slices shown (1 of 4)]
[im 1/25]
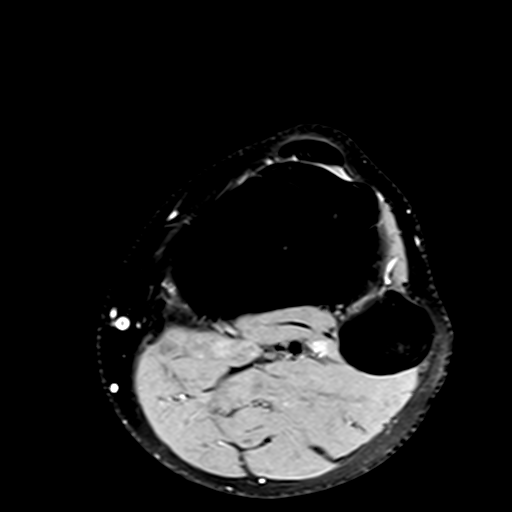
[im 4/25]
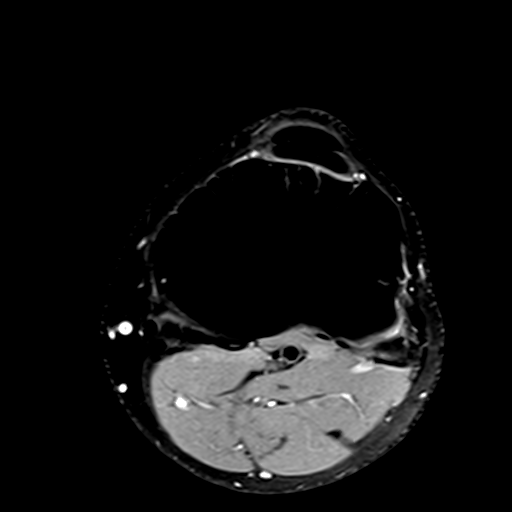
[im 7/25]
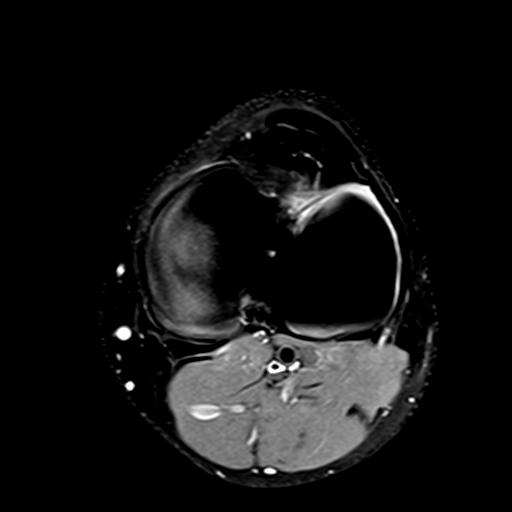
[im 11/25]
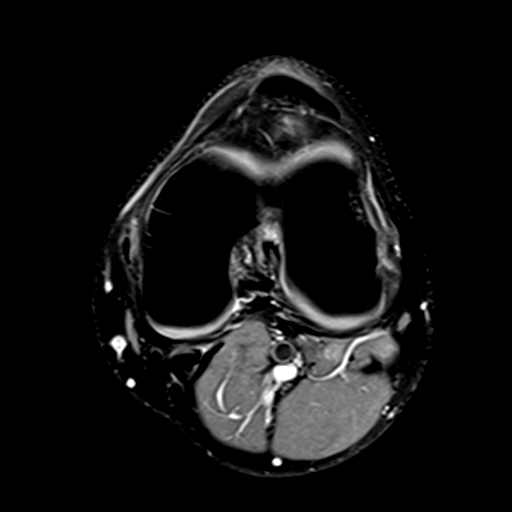
[im 14/25]
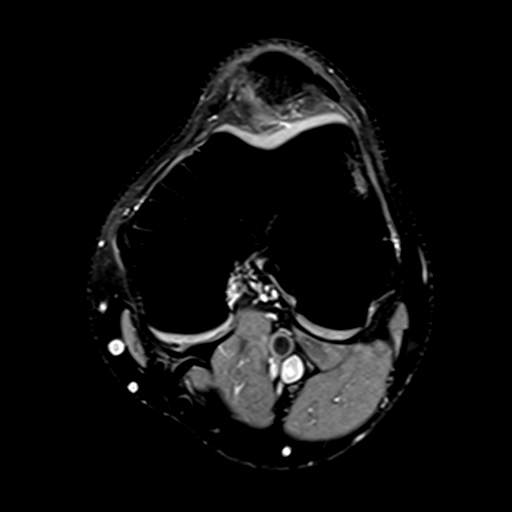
[im 18/25]
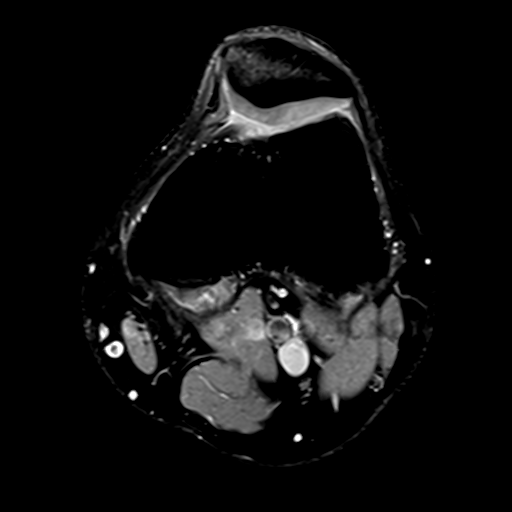
[im 21/25]
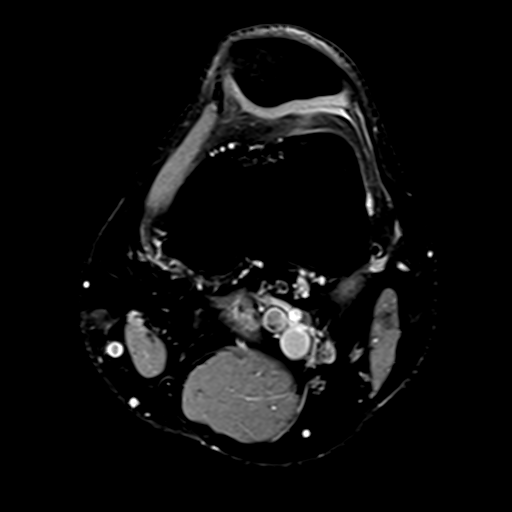
[im 25/25]
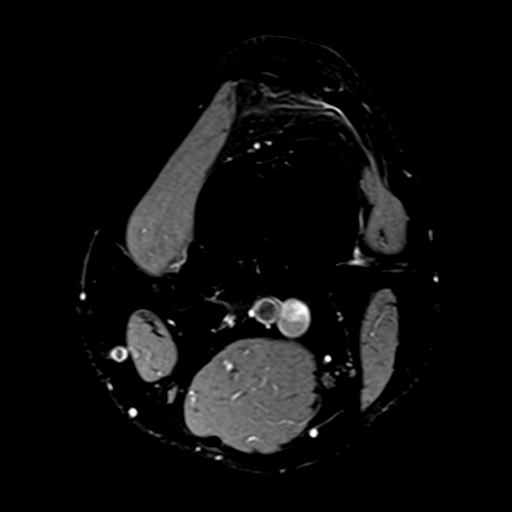

[Series 5: PD fat-sat · coronal · 3.5mm · 0.29mm/px · 5 of 26 slices shown (2 of 4)]
[im 1/26]
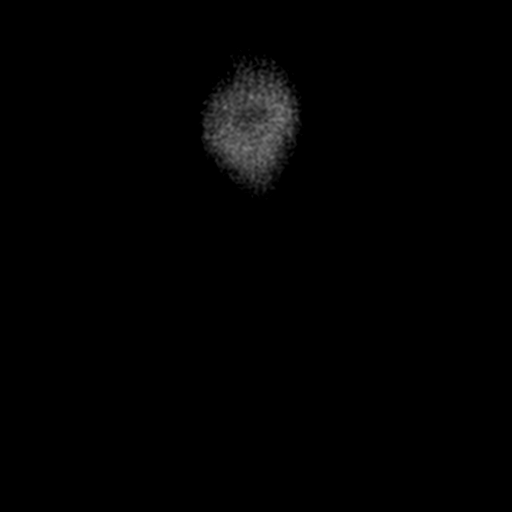
[im 5/26]
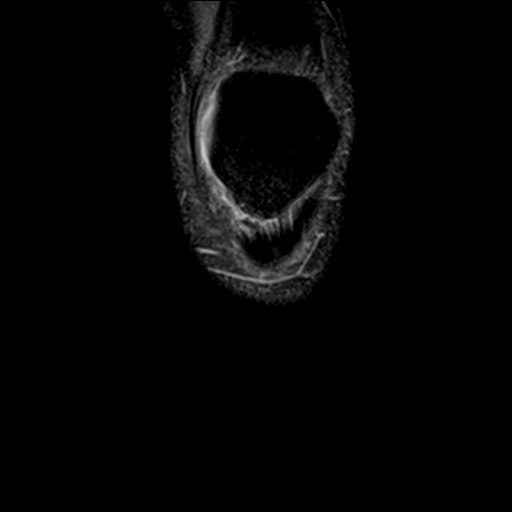
[im 9/26]
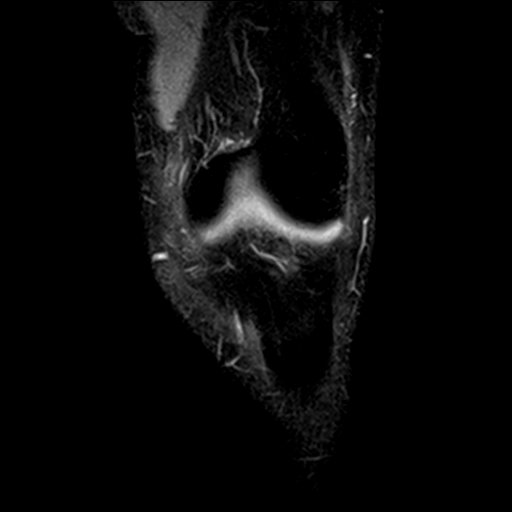
[im 13/26]
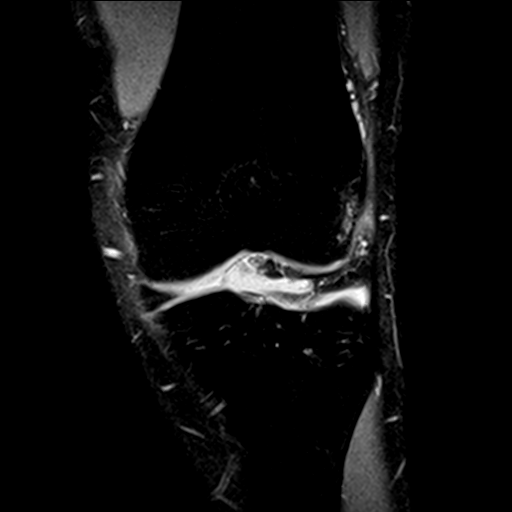
[im 21/26]
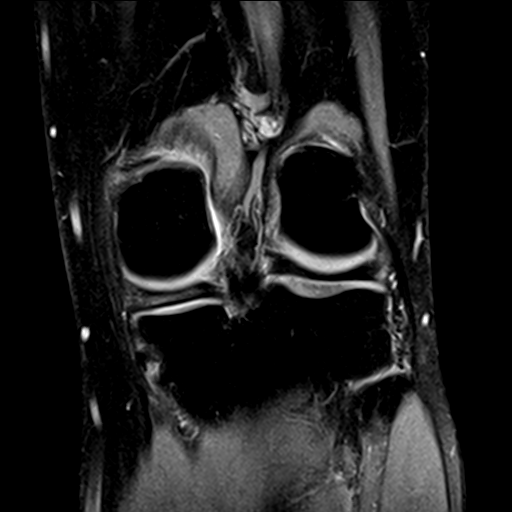

[Series 6: PD fat-sat · sagittal · 3.2mm · 0.29mm/px · 3 of 30 slices shown (3 of 4)]
[im 5/30]
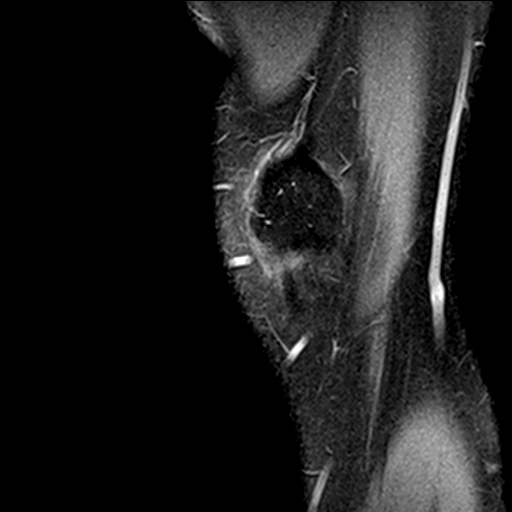
[im 17/30]
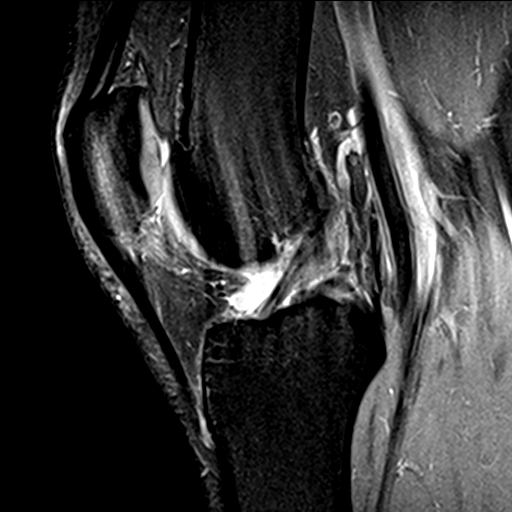
[im 25/30]
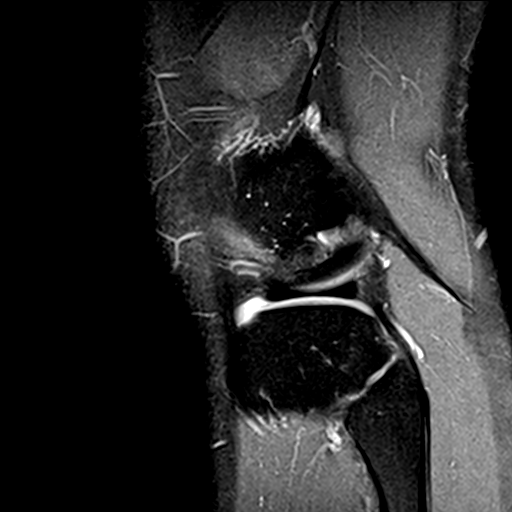

[Series 8: PD fat-sat · oblique · 2.0mm · 0.29mm/px · 3 of 11 slices shown (4 of 4)]
[im 1/11]
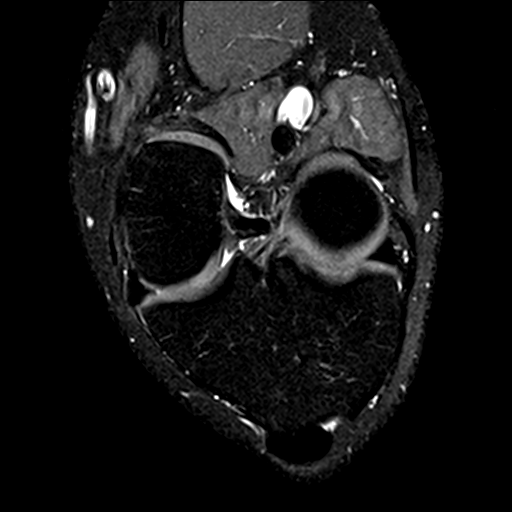
[im 6/11]
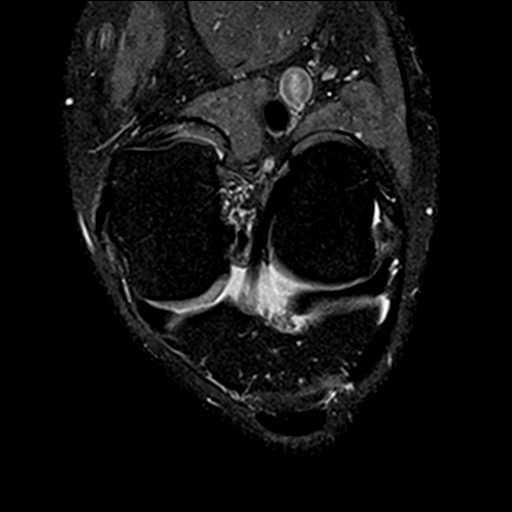
[im 11/11]
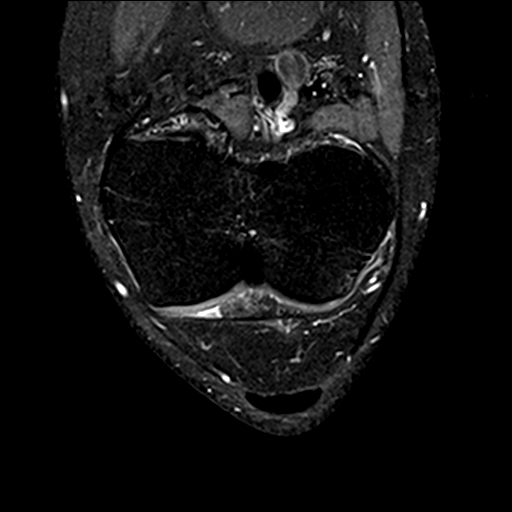

[19 of 40 positions shown; findings below may reference images not displayed]

FINDINGS: MENISCI

Medial meniscus:  Small horizontal tear of the posterior horn.

Lateral meniscus:  Intact.

LIGAMENTS

Cruciates:  Intact ACL and PCL.

Collaterals: Medial collateral ligament is intact. Lateral
collateral ligament complex is intact.

CARTILAGE

Patellofemoral:  Normal.

Medial:  Normal.

Lateral:  Normal.

Joint: No joint effusion. Normal Hoffa's fat. Non thickened
suprapatellar plica.

Popliteal Fossa:  No Baker cyst. Intact popliteus tendon.

Extensor Mechanism: Intact quadriceps tendon and patellar tendon.
Intact medial and lateral patellar retinaculum. Intact MPFL.

Bones: Focal marrow edema in the medial patella and peripheral
lateral femoral condyle. No acute fracture or dislocation.

Other: None.
IMPRESSION: 1. Bone contusions consistent with recent transient patellar
dislocation. Intact medial and lateral patellar retinacular
structures.
2. Small horizontal tear of the medial meniscus posterior horn.
